# Patient Record
Sex: Male | Born: 1980
Health system: Southern US, Community
[De-identification: ages and names within clinical notes are randomized; demographics above are authoritative.]

## PROBLEM LIST (undated history)

## (undated) DIAGNOSIS — I10 Essential (primary) hypertension: Secondary | ICD-10-CM

## (undated) HISTORY — DX: Essential (primary) hypertension: I10

## (undated) HISTORY — PX: BICEPS TENDON REPAIR: SHX566

## (undated) HISTORY — PX: HAND SURGERY: SHX662

---

## 2015-10-24 ENCOUNTER — Emergency Department (HOSPITAL_COMMUNITY)
Admission: EM | Admit: 2015-10-24 | Discharge: 2015-10-25 | Disposition: A | Discharge status: Home / Self Care | Payer: 59 | Attending: Emergency Medicine | Admitting: Emergency Medicine

## 2015-10-24 ENCOUNTER — Encounter (HOSPITAL_COMMUNITY): Payer: Self-pay | Admitting: Emergency Medicine

## 2015-10-24 DIAGNOSIS — R0600 Dyspnea, unspecified: Secondary | ICD-10-CM | POA: Insufficient documentation

## 2015-10-24 DIAGNOSIS — R21 Rash and other nonspecific skin eruption: Secondary | ICD-10-CM | POA: Diagnosis not present

## 2015-10-24 DIAGNOSIS — R61 Generalized hyperhidrosis: Secondary | ICD-10-CM | POA: Diagnosis not present

## 2015-10-24 DIAGNOSIS — J351 Hypertrophy of tonsils: Secondary | ICD-10-CM | POA: Insufficient documentation

## 2015-10-24 DIAGNOSIS — R059 Cough, unspecified: Secondary | ICD-10-CM

## 2015-10-24 DIAGNOSIS — R05 Cough: Secondary | ICD-10-CM | POA: Diagnosis not present

## 2015-10-24 DIAGNOSIS — R0602 Shortness of breath: Secondary | ICD-10-CM | POA: Insufficient documentation

## 2015-10-24 DIAGNOSIS — F17200 Nicotine dependence, unspecified, uncomplicated: Secondary | ICD-10-CM | POA: Insufficient documentation

## 2015-10-24 NOTE — ED Notes (Signed)
Pt states he was melting some plastic earlier today to patch a belt   Pt states since then he has developed a rash on his arms, profuse sweating, and chest tightness

## 2015-10-25 MED ORDER — BENZONATATE 100 MG PO CAPS: 100.0000 mg | ORAL_CAPSULE | Freq: Three times a day (TID) | ORAL | Status: DC

## 2015-10-25 MED ORDER — FAMOTIDINE 20 MG PO TABS
20.0000 mg | ORAL_TABLET | Freq: Once | ORAL | Status: DC
  Filled 2015-10-25: qty 1

## 2015-10-25 MED ORDER — FAMOTIDINE 20 MG PO TABS: 20.0000 mg | ORAL_TABLET | Freq: Two times a day (BID) | ORAL | Status: DC

## 2015-10-25 MED ORDER — PREDNISONE 20 MG PO TABS: 60.0000 mg | ORAL_TABLET | Freq: Every day | ORAL | Status: DC

## 2015-10-25 MED ORDER — PREDNISONE 20 MG PO TABS
60.0000 mg | ORAL_TABLET | Freq: Once | ORAL | Status: DC
  Filled 2015-10-25: qty 3

## 2015-10-25 NOTE — Discharge Instructions (Signed)

## 2015-10-25 NOTE — ED Provider Notes (Signed)
CSN: [id number]     Arrival date & time 10/24/15  2206 History   First MD Initiated Contact with Patient 10/25/15 0206     Chief Complaint  Patient presents with  . 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 Symptoms started around 5:00 pm. He had some chest pressure worse with inspiration. No fever, vomiting, initial rash or difficulty swallowing. He tried taking Robitussin at home without relief. He states that 2-3 weeks ago he had a similar episode, again while working with melted plastic, that was treated by his doctor with Amoxil and diagnosed as bronchitis. He is a smoker. Since arrival in the emergency department he has developed a red itchy rash to bilateral volar forearms.   The history is provided by the patient. No language interpreter was used.    History reviewed. No pertinent past medical history. Past Surgical History  Procedure Laterality Date  . Biceps tendon repair    . Hand surgery     Family History  Problem Relation Age of Onset  . Hypertension Other   . Hyperlipidemia Other   . CAD Other    Social History  Substance Use Topics  . Smoking status: Current Every Day Smoker  . Smokeless tobacco: None  . Alcohol Use: Yes     Comment: occ    Review of Systems  Constitutional: Negative for fever and chills.  HENT: Negative.  Negative for sore throat and trouble swallowing.   Respiratory: Positive for cough and shortness of breath. Negative for wheezing.   Cardiovascular: Negative.   Gastrointestinal: Negative.  Negative for nausea and vomiting.  Musculoskeletal: Negative.  Negative for myalgias.  Skin: Positive for rash.  Neurological: Negative.       Allergies  Review of patient's allergies indicates no known allergies.  Home Medications   Prior to  Admission medications   Not on File   BP 138/82 mmHg  Pulse 93  Temp(Src) 98.2 F (36.8 C) (Oral)  Resp 25  Ht 6' (1.829 m)  Wt 99.791 kg  BMI 29.83 kg/m2  SpO2 96% Physical Exam  Constitutional: He is oriented to person, place, and time. He appears well-developed and well-nourished.  HENT:  Head: Normocephalic.  Oropharyngeal redness without exudates or tonsillar swelling.  Eyes: Conjunctivae are normal.  Neck: Normal range of motion. Neck supple.  Cardiovascular: Normal rate and regular rhythm.   Pulmonary/Chest: Effort normal and breath sounds normal. He has no wheezes. He has no rales.  Abdominal: Soft. Bowel sounds are normal. There is no tenderness. There is no rebound and no guarding.  Musculoskeletal: Normal range of motion. He exhibits no edema.  Neurological: He is alert and oriented to person, place, and time.  Skin: Skin is warm and dry. No rash noted.  Red, raised maculopapular rash to bilateral volar forearms. No hives. No blistering.   Psychiatric: He has a normal mood and affect.    ED Course  Procedures (including critical care time) Labs Review Labs Reviewed - No data to display  Imaging Review No results found. I have personally reviewed and evaluated these images and lab results as part of my medical decision-making.   EKG Interpretation None      MDM   Final diagnoses:  None    1. Cough 2. Rash  DDx: reaction to burning plastic vs bronchitis. Both times he developed symptoms he was working with burning  plastic, now with rash. Have to suspect allergic or adverse affect of exposure. Will treat with Decadron. Encouraged Benadryl and Pepcid. Follow up with PCP for further management if symptoms persist, return here if he worsens.     Elpidio AnisShari Bartley Vuolo, PA-C 10/26/15 [id number]  Tomasita CrumbleAdeleke Oni, MD 10/26/15 [phone]

## 2016-11-13 ENCOUNTER — Ambulatory Visit (INDEPENDENT_AMBULATORY_CARE_PROVIDER_SITE_OTHER): Payer: Worker's Compensation | Admitting: Physician Assistant

## 2016-11-13 ENCOUNTER — Telehealth: Payer: Self-pay | Admitting: Physician Assistant

## 2016-11-13 VITALS — BP 151/79 | HR 63 | Resp 18 | Ht 72.0 in

## 2016-11-13 DIAGNOSIS — S61210A Laceration without foreign body of right index finger without damage to nail, initial encounter: Secondary | ICD-10-CM

## 2016-11-13 MED ORDER — CEPHALEXIN 500 MG PO CAPS: 500.0000 mg | ORAL_CAPSULE | Freq: Three times a day (TID) | ORAL | 0 refills | Status: DC

## 2016-11-13 NOTE — Telephone Encounter (Signed)
Patient left his RX for cephALEXin (KEFLEX) 500 MG capsule at work and will not be able to get it until Tuesday he would like for it to be sent to the [facility] on [address] for him.

## 2016-11-13 NOTE — Progress Notes (Signed)
MRN: [id number] DOB: 06-16-1981  Subjective:  Pt presents to clinic with an injury that occurred at work today.  He was opening a box and he cut his finger.  He has had a lot of bleeding from the wound but he has not cleaned it.  He does not have pain moving the finger.  His last tdap was less than 4 years ago.  Right handed.    Review of Systems  Patients medications, problem list and allergies reviewed. Patients social, past and surgical history is reviewed.   Objective:  BP (!) 151/79   Pulse 63   Resp 18   Ht 6' (1.829 m)   SpO2 100%   Physical Exam  Constitutional: He is oriented to person, place, and time and well-developed, well-nourished, and in no distress.  HENT:  Head: Normocephalic and atraumatic.  Right Ear: External ear normal.  Left Ear: External ear normal.  Eyes: Conjunctivae are normal.  Neck: Normal range of motion.  Pulmonary/Chest: Effort normal.  Musculoskeletal:  Pt has FROM with good strength without pain with flexion and extension.  1 cm laceration over the right index DIP joint on the radial aspect of the joint.  The capsule is lacerated but the tendon is not seen nor the bone.  Neurological: He is alert and oriented to person, place, and time. Gait normal.  Skin: Skin is warm and dry.  Psychiatric: Mood, memory, affect and judgment normal.   Procedure:  Consent obtained.  Local anesthesia with 2% lido.  Wound explored - joint capsule lacerated but the tendon is not seen . Assessment and Plan :  Laceration of right index finger without foreign body without damage to nail, initial encounter - Plan: cephALEXin (KEFLEX) 500 MG capsule - wound care d/w pt.  Pt was encouraged to keep a bulky drsg on the wound to decrease a lot of bending at the joint esp over the next several days - SR in 10 d.   Benny LennertSarah Weber PA-C  [facility] 11/15/2016 9:44 AM

## 2016-11-13 NOTE — Patient Instructions (Addendum)
WOUND CARE  . Keep area clean and dry for 24 hours. Do not remove bandage, if applied. . After 24 hours, remove bandage and wash wound gently with mild soap and warm water. Reapply a new bandage after cleaning wound, if directed. . Continue daily cleansing with soap and water until stitches/staples are removed. . Do not apply any ointments or creams to the wound while stitches/staples are in place, as this may cause delayed healing. . Notify the office if you experience any of the following signs of infection: Swelling, redness, pus drainage, streaking, fever >101.0 F . Notify the office if you experience excessive bleeding that does not stop after 15-20 minutes of constant, firm pressure.      IF you received an x-ray today, you will receive an invoice from [facility]. Please contact [facility] at [phone] with questions or concerns regarding your invoice.   IF you received labwork today, you will receive an invoice from [facility]. Please contact [facility] at [phone] with questions or concerns regarding your invoice.   Our billing staff will not be able to assist you with questions regarding bills from these companies.  You will be contacted with the lab results as soon as they are available. The fastest way to get your results is to activate your My Chart account. Instructions are located on the last page of this paperwork. If you have not heard from us regarding the results in 2 weeks, please contact this office.      

## 2016-11-14 MED ORDER — CEPHALEXIN 500 MG PO CAPS: 500.0000 mg | ORAL_CAPSULE | Freq: Three times a day (TID) | ORAL | 0 refills | Status: DC

## 2016-11-14 NOTE — Telephone Encounter (Signed)
Yes please resend. Deliah Boston, MS, PA-C 10:52 AM, 11/14/2016

## 2016-11-14 NOTE — Telephone Encounter (Signed)
Ok to re-send?  °

## 2016-11-14 NOTE — Telephone Encounter (Signed)
resent

## 2016-12-25 ENCOUNTER — Encounter: Payer: Self-pay | Admitting: Physician Assistant

## 2016-12-25 ENCOUNTER — Ambulatory Visit (INDEPENDENT_AMBULATORY_CARE_PROVIDER_SITE_OTHER): Payer: 59 | Admitting: Physician Assistant

## 2016-12-25 ENCOUNTER — Ambulatory Visit (INDEPENDENT_AMBULATORY_CARE_PROVIDER_SITE_OTHER): Payer: 59

## 2016-12-25 VITALS — BP 130/88 | HR 65 | Temp 98.4°F | Resp 16 | Ht 71.0 in | Wt 237.0 lb

## 2016-12-25 DIAGNOSIS — B070 Plantar wart: Secondary | ICD-10-CM | POA: Diagnosis not present

## 2016-12-25 DIAGNOSIS — Z114 Encounter for screening for human immunodeficiency virus [HIV]: Secondary | ICD-10-CM | POA: Diagnosis not present

## 2016-12-25 DIAGNOSIS — Z6832 Body mass index (BMI) 32.0-32.9, adult: Secondary | ICD-10-CM | POA: Insufficient documentation

## 2016-12-25 DIAGNOSIS — M25572 Pain in left ankle and joints of left foot: Secondary | ICD-10-CM | POA: Diagnosis not present

## 2016-12-25 DIAGNOSIS — Z6833 Body mass index (BMI) 33.0-33.9, adult: Secondary | ICD-10-CM | POA: Diagnosis not present

## 2016-12-25 DIAGNOSIS — I10 Essential (primary) hypertension: Secondary | ICD-10-CM

## 2016-12-25 DIAGNOSIS — M25571 Pain in right ankle and joints of right foot: Secondary | ICD-10-CM

## 2016-12-25 MED ORDER — INDOMETHACIN 50 MG PO CAPS: 50.0000 mg | ORAL_CAPSULE | Freq: Two times a day (BID) | ORAL | 1 refills | Status: DC

## 2016-12-25 MED ORDER — LISINOPRIL 5 MG PO TABS: 5.0000 mg | ORAL_TABLET | Freq: Every day | ORAL | 3 refills | Status: DC

## 2016-12-25 NOTE — Assessment & Plan Note (Signed)
Continue healthy lifestyle modifications.

## 2016-12-25 NOTE — Assessment & Plan Note (Signed)
Restart treatment, with lisinopril 5 mg. COntinue healthy lifestyle modifications.

## 2016-12-25 NOTE — Progress Notes (Signed)
Patient ID: Rickey Davis, male     DOB: 15-Feb-1981, 36 y.o.    MRN: [id number]  PCP: Patient, No Pcp Per  Chief Complaint  Patient presents with  . Gout    LEFT ANKLE   . elevated BP at triage    pt states family history HTN    Subjective:   This patient is new to me and presents for evaluation of LEFT ankle pain and HTN.  Previously seen here for laceration repair.  Started with pain in the LEFT great toe 4 days ago. Subtle. That resolved, as he noted pain in the ankle, where is usually occurs. Lateral LEFT ankle pain episodically. Monthly x 2 weeks at a time, until he lost weight by changing lifestyle, and he hadn't had an episode in about 6 months until this one. Usually takes ibuprofen, though it doesn't work, tart cherry juice, and tries to hydrate. Has also eliminated shrimp for his diet. History of ankle fracture.  HTN previously treated with metoprolol. Stopped it when he lost health insurance and couldn't afford care.  Review of Systems No chest pain, SOB, HA, dizziness, vision change, N/V, diarrhea, constipation, dysuria, urinary urgency or frequency, myalgias, other arthralgias or rash.   Prior to Admission medications   Medication Sig Start Date End Date Taking? Authorizing Provider  ibuprofen (ADVIL,MOTRIN) 200 MG tablet Take 200 mg by mouth as needed.   Yes [provider]     No Known Allergies   Patient Active Problem List   Diagnosis Date Noted  . Benign essential HTN 12/25/2016  . BMI 33.0-33.9,adult 12/25/2016     Family History  Problem Relation Age of Onset  . Hypertension Other   . Hyperlipidemia Other   . CAD Other   . Hypertension Mother   . Hypertension Father   . Non-Hodgkin's lymphoma Sister   . Hypertension Brother   . Hyperlipidemia Brother      Social History   Social History  . Marital status: Single    Spouse name: n/a  . Number of children: 0  . Years of education: 3 years of college   Occupational History   . maintenance [employer]   Social History Main Topics  . Smoking status: Current Every Day Smoker    Packs/day: 0.75    Years: 13.00  . Smokeless tobacco: Never Used  . Alcohol use Yes     Comment: occ  . Drug use: Yes    Types: Marijuana     Comment: once a year  . Sexual activity: Not on file   Other Topics Concern  . Not on file   Social History Narrative   Lives alone.   [place]ister lives in [place], [place]   Other family lives in [place].         Objective:  Physical Exam  Constitutional: He is oriented to person, place, and time. He appears well-developed and well-nourished. He is active and cooperative. No distress.  BP 130/88 (BP Location: Left Arm, Cuff Size: Large)   Pulse 65   Temp 98.4 F (36.9 C) (Oral)   Resp 16   Ht 5\' 11"  (1.803 m)   Wt 237 lb (107.5 kg)   SpO2 97%   BMI 33.05 kg/m    Eyes: Conjunctivae are normal.  Pulmonary/Chest: Effort normal.  Neurological: He is alert and oriented to person, place, and time.  Skin: Lesion (verrucal lesion on plantar surfacce of LEFT foot) noted.  Psychiatric: He has a  normal mood and affect. His speech is normal and behavior is normal.         Dg Ankle Complete Left  Result Date: 12/25/2016 CLINICAL DATA:  Left lateral ankle pain. History of previous fracture. EXAM: LEFT ANKLE COMPLETE - 3+ VIEW COMPARISON:  None in PACs FINDINGS: The bones are subjectively adequately mineralized. There are subcentimeter bony densities adjacent to the inferior tip of the lateral malleolus likely reflecting old avulsion fracture fragments. The ankle joint mortise is preserved. The talar dome is intact. There is no acute malleolar fracture. The talus and calcaneus appear intact. There is mild diffuse soft tissue swelling. IMPRESSION: No acute fracture or dislocation. Evidence of previous fracture likely of the tip of the lateral malleolus. Electronically Signed   By: David  Swaz[place] M.D.   On:  12/25/2016 10:28       Assessment & Plan:   Problem List Items Addressed This Visit    Benign essential HTN    Restart treatment, with lisinopril 5 mg. COntinue healthy lifestyle modifications.      Relevant Medications   lisinopril (PRINIVIL,ZESTRIL) 5 MG tablet   Other Relevant Orders   CBC with Differential/Platelet   Comprehensive metabolic panel   Lipid panel   TSH   T4, free   BMI 33.0-33.9,adult    Continue healthy lifestyle modifications.      Plantar wart of left foot    Continue home treatment. COnsider cryotherapy in the future if desires.       Other Visit Diagnoses    Pain in joint involving right ankle and foot    -  Primary   Possible gout. Indomethacin trial. If uric acid is elevated, will start allopurinol. Continue hydration.   Relevant Medications   indomethacin (INDOCIN) 50 MG capsule   Other Relevant Orders   DG Ankle Complete Left (Completed)   Uric Acid   Screening for HIV (human immunodeficiency virus)       Relevant Orders   HIV antibody       Return in about 6 weeks (around 02/05/2017) for re-evaluation of blood pressure.   Fernande Bras, PA-C Primary Care at [facility]

## 2016-12-25 NOTE — Assessment & Plan Note (Signed)
Continue home treatment. COnsider cryotherapy in the future if desires.

## 2016-12-25 NOTE — Patient Instructions (Signed)
     IF you received an x-ray today, you will receive an invoice from [facility]. Please contact [facility] at [phone] with questions or concerns regarding your invoice.   IF you received labwork today, you will receive an invoice from [facility]. Please contact [facility] at [phone] with questions or concerns regarding your invoice.   Our billing staff will not be able to assist you with questions regarding bills from these companies.  You will be contacted with the lab results as soon as they are available. The fastest way to get your results is to activate your My Chart account. Instructions are located on the last page of this paperwork. If you have not heard from us regarding the results in 2 weeks, please contact this office.     

## 2016-12-26 LAB — COMPREHENSIVE METABOLIC PANEL
A/G RATIO: 2.2 (ref 1.2–2.2)
ALK PHOS: 66 IU/L (ref 39–117)
ALT: 26 IU/L (ref 0–44)
AST: 14 IU/L (ref 0–40)
Albumin: 5 g/dL (ref 3.5–5.5)
BILIRUBIN TOTAL: 0.4 mg/dL (ref 0.0–1.2)
BUN/Creatinine Ratio: 13 (ref 9–20)
BUN: 14 mg/dL (ref 6–20)
CHLORIDE: 100 mmol/L (ref 96–106)
CO2: 26 mmol/L (ref 18–29)
Calcium: 9.7 mg/dL (ref 8.7–10.2)
Creatinine, Ser: 1.04 mg/dL (ref 0.76–1.27)
GFR calc non Af Amer: 91 mL/min/{1.73_m2} (ref >59)
GFR, EST AFRICAN AMERICAN: 106 mL/min/{1.73_m2} (ref >59)
GLUCOSE: 91 mg/dL (ref 65–99)
Globulin, Total: 2.3 g/dL (ref 1.5–4.5)
POTASSIUM: 4.9 mmol/L (ref 3.5–5.2)
Sodium: 139 mmol/L (ref 134–144)
Total Protein: 7.3 g/dL (ref 6.0–8.5)

## 2016-12-26 LAB — CBC WITH DIFFERENTIAL/PLATELET

## 2016-12-26 LAB — HIV ANTIBODY (ROUTINE TESTING W REFLEX): HIV SCREEN 4TH GENERATION: NONREACTIVE

## 2016-12-26 LAB — LIPID PANEL
CHOLESTEROL TOTAL: 208 mg/dL — AB (ref 100–199)
Chol/HDL Ratio: 5 ratio (ref 0.0–5.0)
HDL: 42 mg/dL (ref >39)
LDL Calculated: 127 mg/dL — ABNORMAL HIGH (ref 0–99)
TRIGLYCERIDES: 193 mg/dL — AB (ref 0–149)
VLDL Cholesterol Cal: 39 mg/dL (ref 5–40)

## 2016-12-26 LAB — URIC ACID: Uric Acid: 8.1 mg/dL (ref 3.7–8.6)

## 2016-12-26 LAB — T4, FREE: Free T4: 1.08 ng/dL (ref 0.82–1.77)

## 2016-12-26 LAB — TSH: TSH: 1.51 u[IU]/mL (ref 0.450–4.500)

## 2017-01-01 ENCOUNTER — Other Ambulatory Visit: Payer: Self-pay

## 2017-01-01 MED ORDER — ALLOPURINOL 100 MG PO TABS: 100.0000 mg | ORAL_TABLET | Freq: Every day | ORAL | 3 refills | Status: DC

## 2017-01-01 NOTE — Telephone Encounter (Signed)
See lab note.  pt has been feeling faint since on ibuprofen and lisinopril when exerting self at work. No chest pain or sob I advised take b/p when this occurs and notify us of readings. Drink plenty of fluids.

## 2017-01-09 ENCOUNTER — Encounter: Payer: Self-pay | Admitting: Radiology

## 2017-01-27 ENCOUNTER — Ambulatory Visit (INDEPENDENT_AMBULATORY_CARE_PROVIDER_SITE_OTHER): Payer: 59 | Admitting: Physician Assistant

## 2017-01-27 ENCOUNTER — Encounter: Payer: Self-pay | Admitting: Physician Assistant

## 2017-01-27 VITALS — BP 130/84 | HR 72 | Temp 97.6°F | Ht 72.0 in | Wt 239.4 lb

## 2017-01-27 DIAGNOSIS — Z6832 Body mass index (BMI) 32.0-32.9, adult: Secondary | ICD-10-CM

## 2017-01-27 DIAGNOSIS — I10 Essential (primary) hypertension: Secondary | ICD-10-CM

## 2017-01-27 DIAGNOSIS — M109 Gout, unspecified: Secondary | ICD-10-CM | POA: Diagnosis not present

## 2017-01-27 NOTE — Progress Notes (Signed)
   Subjective:    Patient ID: Rickey Davis, male    DOB: Feb 21, 1981, 36 y.o.   MRN: [id number] PCP: Porfirio OarJeffery, Chelle, PA-C  HPI: 36 y/o M presents today for a follow up of his blood pressure. He was started on Lisinopril 2656m on 12/26/2016. When he first started the medication he had some associated lightheadedness that has completely resolved. He denies vision changes, CP, SOB, lower extremity swelling. Diet changes have been made and include more salads than he has eaten in the past, drinks mostly gatorade.  Exercise- mostly at work, Data processing managermaintenance mechanic.   Patient Active Problem List   Diagnosis Date Noted  . Benign essential HTN 12/25/2016  . BMI 33.0-33.9,adult 12/25/2016  . Plantar wart of left foot 12/25/2016   Prior to Admission medications   Medication Sig Start Date End Date Taking? Authorizing Provider  allopurinol (ZYLOPRIM) 100 MG tablet Take 1 tablet (100 mg total) by mouth daily. 01/01/17   Porfirio OarJeffery, Chelle, PA-C  ibuprofen (ADVIL,MOTRIN) 200 MG tablet Take 200 mg by mouth as needed.    [provider]  indomethacin (INDOCIN) 50 MG capsule Take 1 capsule (50 mg total) by mouth 2 (two) times daily with a meal. 12/25/16   Jeffery, Chelle, PA-C  lisinopril (PRINIVIL,ZESTRIL) 5 MG tablet Take 1 tablet (5 mg total) by mouth daily. 12/25/16   Porfirio OarJeffery, Chelle, PA-C   No Known Allergies    Review of Systems  Constitutional: Positive for fatigue.  Eyes: Negative for visual disturbance.  Respiratory: Negative for cough, chest tightness and shortness of breath.   Cardiovascular: Negative for chest pain.  Gastrointestinal: Negative for abdominal pain, constipation, diarrhea, nausea and vomiting.  Endocrine: Negative for polydipsia and polyuria.  Genitourinary: Negative.   Musculoskeletal: Negative.   Skin: Negative.   Allergic/Immunologic: Negative.   Neurological: Negative for dizziness and headaches.  Hematological: Negative.   Psychiatric/Behavioral: Negative.          Objective:   Physical Exam  Constitutional: He is oriented to person, place, and time. He appears well-developed and well-nourished. No distress.  BP 130/84 (BP Location: Left Arm, Patient Position: Sitting, Cuff Size: Large)   Pulse 72   Temp 97.6 F (36.4 C) (Oral)   Ht 6' (1.829 m)   Wt 239 lb 6.4 oz (108.6 kg)   SpO2 98%   BMI 32.47 kg/m    HENT:  Head: Normocephalic and atraumatic.  Eyes: Conjunctivae and EOM are normal. Pupils are equal, round, and reactive to light.  Neck: Normal range of motion. Neck supple. No thyromegaly present.  Cardiovascular: Normal rate, regular rhythm, normal heart sounds and intact distal pulses.   Pulmonary/Chest: Effort normal and breath sounds normal.  Lymphadenopathy:    He has no cervical adenopathy.  Neurological: He is alert and oriented to person, place, and time.  Skin: Skin is warm and dry. He is not diaphoretic.  Psychiatric: He has a normal mood and affect. His behavior is normal. Judgment and thought content normal.        Assessment & Plan:  1. Benign essential HTN Blood pressure well controlled with Lisinopril 5 mg and lifestyle changes.  - Basic metabolic panel  2. Gout of left ankle, unspecified cause, unspecified chronicity Last attack about a month ago. Well controlled with Allopurinol.  - Uric Acid  3. BMI 32.0-32.9,adult Continue to make smart food choices and maintain active lifestyle.  Return in about 6 months (around 07/29/2017) for re-evaluation of blood pressure.

## 2017-01-27 NOTE — Progress Notes (Signed)
Patient ID: Rickey Davis, male    DOB: 30-Nov-1980, 36 y.o.   MRN: [id number]  PCP: Porfirio Oar, PA-C  Chief Complaint  Patient presents with  . Hypertension  . Follow-up    Subjective:   Presents for evaluation of HTN and presumed gout.  At his last visit, for recurrent ankle pain, he was started on indomethacin and lisinopril. Uric acid level was at the upper limit of normal, so he was started on allopurinol. He has tolerated new medications well and has not had another episode of the ankle pain since then. Initially, he had a little dizziness with the lisinopril, which resolved.  No CP, SOB, HA. No muscle or joint pain.  He has made some healthy eating changes, more salads.  Review of Systems No chest pain, SOB, HA, dizziness, vision change, N/V, diarrhea, constipation, dysuria, urinary urgency or frequency, myalgias, arthralgias or rash.     Patient Active Problem List   Diagnosis Date Noted  . Benign essential HTN 12/25/2016  . BMI 33.0-33.9,adult 12/25/2016  . Plantar wart of left foot 12/25/2016     Prior to Admission medications   Medication Sig Start Date End Date Taking? Authorizing Provider  allopurinol (ZYLOPRIM) 100 MG tablet Take 1 tablet (100 mg total) by mouth daily. 01/01/17  Yes Kahne Helfand, PA-C  ibuprofen (ADVIL,MOTRIN) 200 MG tablet Take 200 mg by mouth as needed.   Yes [provider]  indomethacin (INDOCIN) 50 MG capsule Take 1 capsule (50 mg total) by mouth 2 (two) times daily with a meal. 12/25/16  Yes Jhony Antrim, PA-C  lisinopril (PRINIVIL,ZESTRIL) 5 MG tablet Take 1 tablet (5 mg total) by mouth daily. 12/25/16  Yes Amandajo Gonder, PA-C     No Known Allergies     Objective:  Physical Exam  Constitutional: He is oriented to person, place, and time. He appears well-developed and well-nourished. He is active and cooperative. No distress.  BP 130/84 (BP Location: Left Arm, Patient Position: Sitting, Cuff Size: Large)    Pulse 72   Temp 97.6 F (36.4 C) (Oral)   Ht 6' (1.829 m)   Wt 239 lb 6.4 oz (108.6 kg)   SpO2 98%   BMI 32.47 kg/m   HENT:  Head: Normocephalic and atraumatic.  Right Ear: Hearing normal.  Left Ear: Hearing normal.  Eyes: Conjunctivae are normal. No scleral icterus.  Neck: Normal range of motion. Neck supple. No thyromegaly present.  Cardiovascular: Normal rate, regular rhythm and normal heart sounds.   Pulses:      Radial pulses are 2+ on the right side, and 2+ on the left side.  Pulmonary/Chest: Effort normal and breath sounds normal.  Lymphadenopathy:       Head (right side): No tonsillar, no preauricular, no posterior auricular and no occipital adenopathy present.       Head (left side): No tonsillar, no preauricular, no posterior auricular and no occipital adenopathy present.    He has no cervical adenopathy.       Right: No supraclavicular adenopathy present.       Left: No supraclavicular adenopathy present.  Neurological: He is alert and oriented to person, place, and time. No sensory deficit.  Skin: Skin is warm, dry and intact. No rash noted. No cyanosis or erythema. Nails show no clubbing.  Psychiatric: He has a normal mood and affect. His speech is normal and behavior is normal.       Wt Readings from Last 3 Encounters:  01/27/17 239 lb  6.4 oz (108.6 kg)  12/25/16 237 lb (107.5 kg)  10/24/15 220 lb (99.8 kg)       Assessment & Plan:   Problem List Items Addressed This Visit    Benign essential HTN - Primary    Controlled. Continue lisinopril 5 mg daily. Continue healthy eating changes.      Relevant Orders   Basic metabolic panel   BMI 32.0-32.9,adult    Continue healthy lifestyle changes.      Gout of left ankle    Stable on allopurinol. PRN indomethacin.      Relevant Orders   Uric Acid       Return in about 6 months (around 07/29/2017) for re-evaluation of blood pressure.   Fernande Brashelle S. Rhyan Radler, PA-C [facility] at [facility]

## 2017-01-27 NOTE — Assessment & Plan Note (Signed)
Controlled. Continue lisinopril 5 mg daily. Continue healthy eating changes.

## 2017-01-27 NOTE — Patient Instructions (Signed)
     IF you received an x-ray today, you will receive an invoice from [facility]. Please contact [facility] at [phone] with questions or concerns regarding your invoice.   IF you received labwork today, you will receive an invoice from [facility]. Please contact [facility] at [phone] with questions or concerns regarding your invoice.   Our billing staff will not be able to assist you with questions regarding bills from these companies.  You will be contacted with the lab results as soon as they are available. The fastest way to get your results is to activate your My Chart account. Instructions are located on the last page of this paperwork. If you have not heard from us regarding the results in 2 weeks, please contact this office.     

## 2017-01-27 NOTE — Assessment & Plan Note (Signed)
Continue healthy lifestyle changes.

## 2017-01-27 NOTE — Assessment & Plan Note (Signed)
Stable on allopurinol. PRN indomethacin.

## 2017-01-28 LAB — BASIC METABOLIC PANEL
BUN/Creatinine Ratio: 21 — ABNORMAL HIGH (ref 9–20)
BUN: 17 mg/dL (ref 6–20)
CALCIUM: 9.9 mg/dL (ref 8.7–10.2)
CHLORIDE: 100 mmol/L (ref 96–106)
CO2: 23 mmol/L (ref 20–29)
Creatinine, Ser: 0.82 mg/dL (ref 0.76–1.27)
GFR calc Af Amer: 130 mL/min/{1.73_m2} (ref >59)
GFR, EST NON AFRICAN AMERICAN: 112 mL/min/{1.73_m2} (ref >59)
GLUCOSE: 108 mg/dL — AB (ref 65–99)
POTASSIUM: 5.1 mmol/L (ref 3.5–5.2)
SODIUM: 140 mmol/L (ref 134–144)

## 2017-01-28 LAB — URIC ACID: URIC ACID: 6.2 mg/dL (ref 3.7–8.6)

## 2017-02-23 ENCOUNTER — Encounter: Payer: Self-pay | Admitting: Physician Assistant

## 2017-06-22 IMAGING — DX DG ANKLE COMPLETE 3+V*L*
3 series · 3 of 3 positions shown · non-contrast
Comparison: None in PACs

CLINICAL DATA: Left lateral ankle pain. History of previous
fracture.

EXAM:
LEFT ANKLE COMPLETE - 3+ VIEW

[ankle ap]
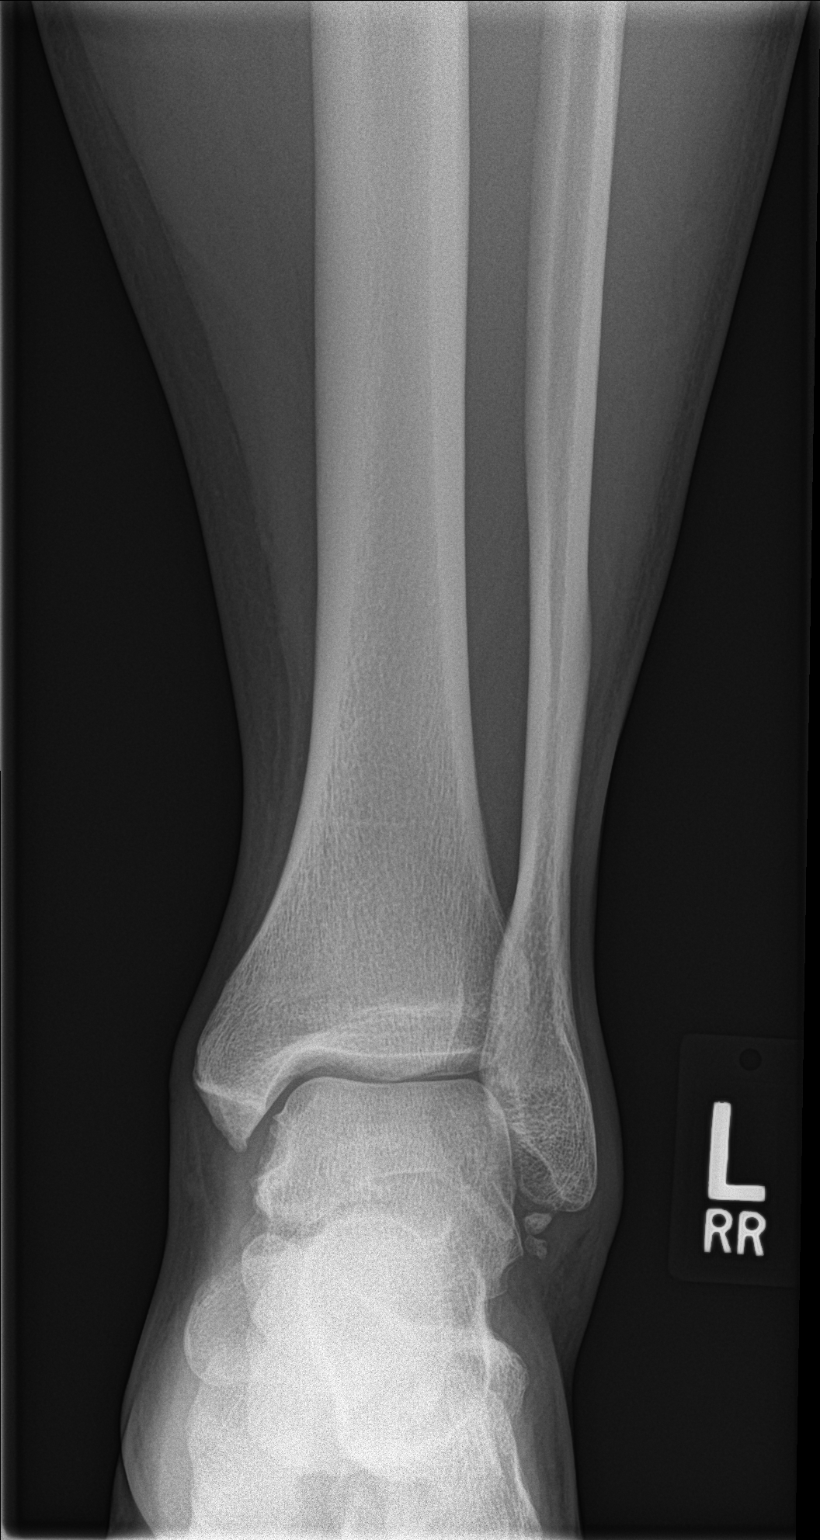

[ankle obl]
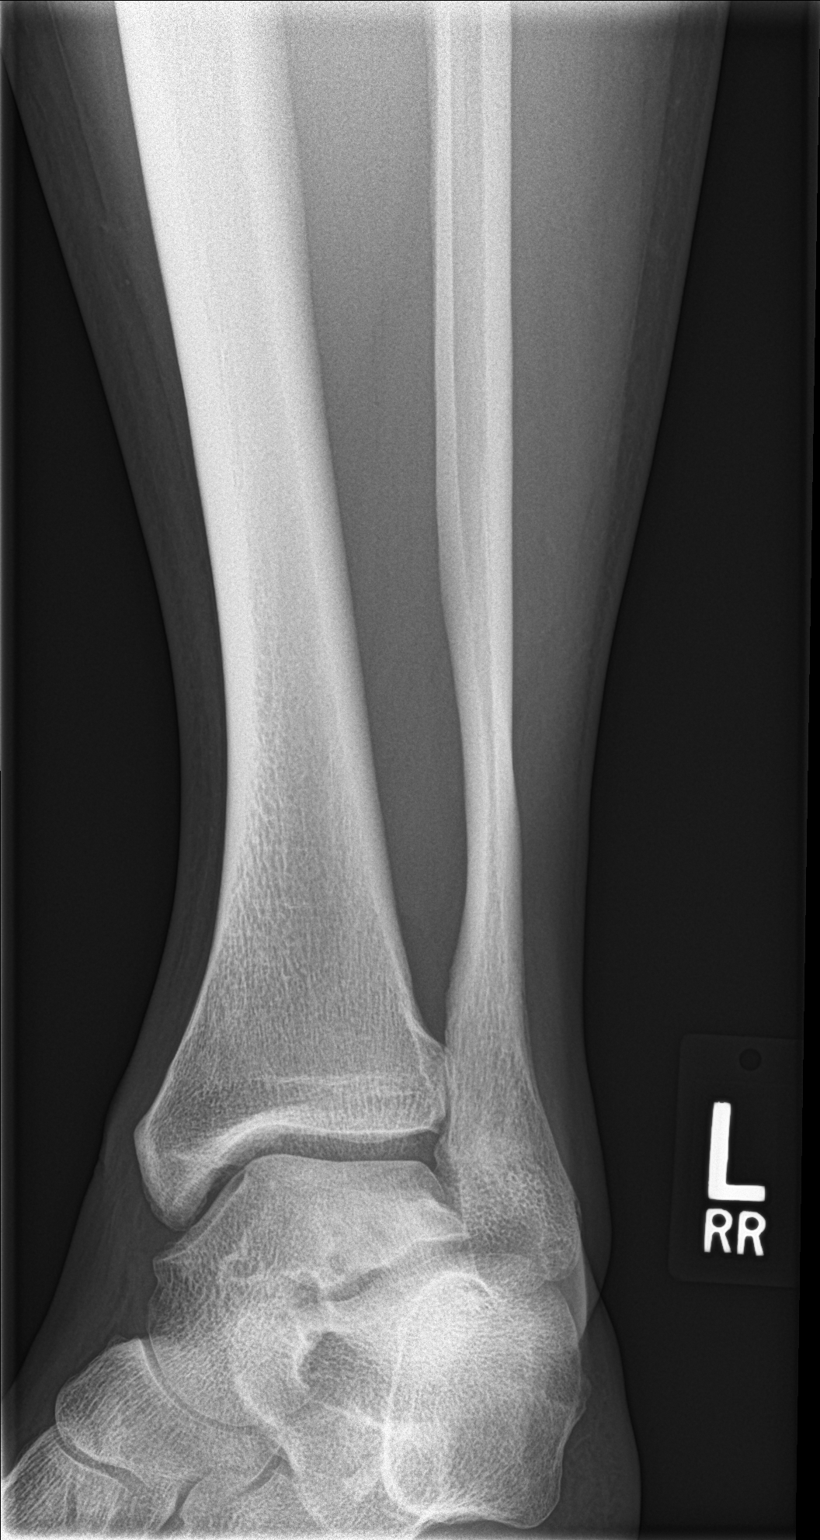

[ankle lat]
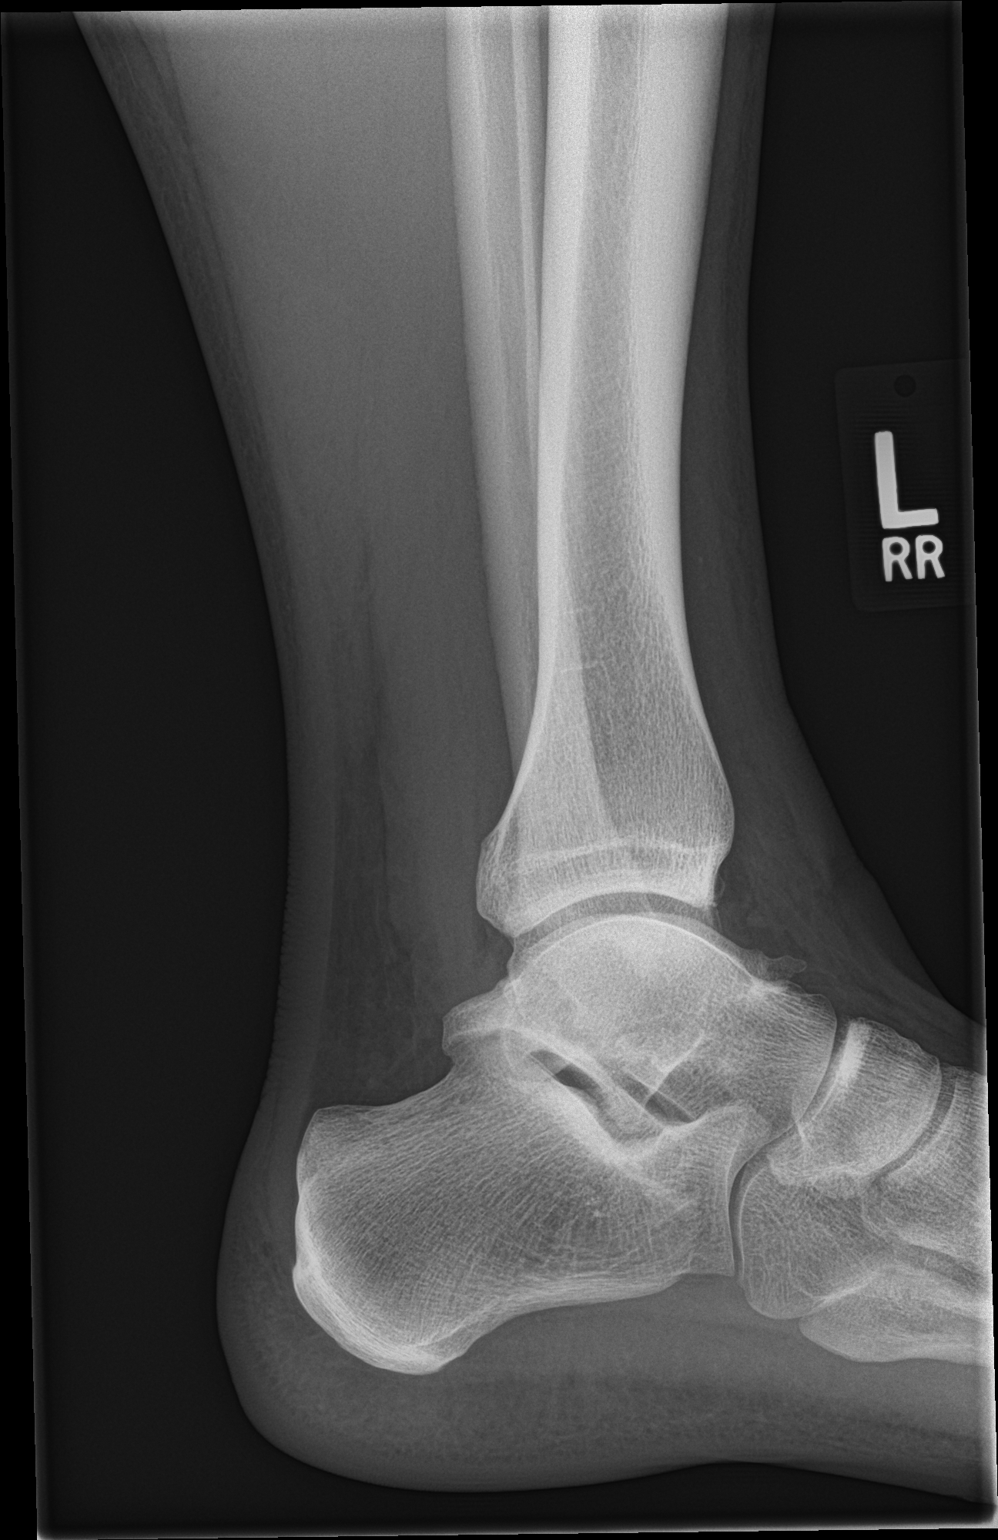

[3 of 3 positions shown; findings below may reference images not displayed]

FINDINGS: The bones are subjectively adequately mineralized. There are
subcentimeter bony densities adjacent to the inferior tip of the
lateral malleolus likely reflecting old avulsion fracture fragments.
The ankle joint mortise is preserved. The talar dome is intact.
There is no acute malleolar fracture. The talus and calcaneus appear
intact. There is mild diffuse soft tissue swelling.
IMPRESSION: No acute fracture or dislocation. Evidence of previous fracture
likely of the tip of the lateral malleolus.

## 2017-07-31 ENCOUNTER — Ambulatory Visit: Payer: Self-pay | Admitting: Physician Assistant

## 2017-11-19 ENCOUNTER — Encounter: Payer: Self-pay | Admitting: Physician Assistant

## 2018-01-07 ENCOUNTER — Other Ambulatory Visit: Payer: Self-pay | Admitting: Physician Assistant

## 2018-01-07 DIAGNOSIS — I10 Essential (primary) hypertension: Secondary | ICD-10-CM

## 2018-09-28 DIAGNOSIS — M25521 Pain in right elbow: Secondary | ICD-10-CM | POA: Insufficient documentation

## 2018-11-30 ENCOUNTER — Other Ambulatory Visit: Payer: Self-pay

## 2018-11-30 DIAGNOSIS — Z13228 Encounter for screening for other metabolic disorders: Secondary | ICD-10-CM

## 2018-11-30 DIAGNOSIS — Z1329 Encounter for screening for other suspected endocrine disorder: Secondary | ICD-10-CM

## 2018-11-30 DIAGNOSIS — Z13220 Encounter for screening for lipoid disorders: Secondary | ICD-10-CM

## 2018-11-30 DIAGNOSIS — Z130 Encounter for screening for diseases of the blood and blood-forming organs and certain disorders involving the immune mechanism: Secondary | ICD-10-CM

## 2018-11-30 DIAGNOSIS — Z1389 Encounter for screening for other disorder: Secondary | ICD-10-CM

## 2018-12-06 ENCOUNTER — Telehealth: Payer: Self-pay | Admitting: Family Medicine

## 2018-12-06 NOTE — Telephone Encounter (Signed)
calle patient no VM set up unable to leave message . Need to convert OV to Webex or telemed  FR

## 2018-12-10 ENCOUNTER — Encounter: Payer: Self-pay | Admitting: Family Medicine

## 2018-12-20 ENCOUNTER — Telehealth (INDEPENDENT_AMBULATORY_CARE_PROVIDER_SITE_OTHER): Payer: 59 | Admitting: Family Medicine

## 2018-12-20 ENCOUNTER — Other Ambulatory Visit: Payer: Self-pay

## 2018-12-20 DIAGNOSIS — F17200 Nicotine dependence, unspecified, uncomplicated: Secondary | ICD-10-CM | POA: Diagnosis not present

## 2018-12-20 DIAGNOSIS — I10 Essential (primary) hypertension: Secondary | ICD-10-CM | POA: Diagnosis not present

## 2018-12-20 DIAGNOSIS — M1A0720 Idiopathic chronic gout, left ankle and foot, without tophus (tophi): Secondary | ICD-10-CM

## 2018-12-20 MED ORDER — BUPROPION HCL ER (SR) 150 MG PO TB12: 150.0000 mg | ORAL_TABLET | Freq: Two times a day (BID) | ORAL | 2 refills | Status: DC

## 2018-12-20 MED ORDER — LISINOPRIL 5 MG PO TABS: 5.0000 mg | ORAL_TABLET | Freq: Every day | ORAL | 1 refills | Status: AC

## 2018-12-20 MED ORDER — INDOMETHACIN 50 MG PO CAPS: 50.0000 mg | ORAL_CAPSULE | Freq: Two times a day (BID) | ORAL | 1 refills | Status: AC | PRN

## 2018-12-20 MED ORDER — ALLOPURINOL 100 MG PO TABS: 100.0000 mg | ORAL_TABLET | Freq: Every day | ORAL | 1 refills | Status: AC

## 2018-12-20 NOTE — Progress Notes (Signed)
Has been off of medication for 57yrs, wanting to get back on the allopurinol 100mg  and lisinopril 5mg .has lab work that is pending for future draw.

## 2018-12-20 NOTE — Progress Notes (Signed)
Virtual Visit Note  I connected with patient on 12/20/18 at 302pm  by phone due to techinical difficulties and verified that I am speaking with the correct person using two identifiers. Rickey Davis is currently located at home and patient is currently with them during visit. The provider, Rickey LippsIrma M Santiago, MD is located in their office at time of visit.  I discussed the limitations, risks, security and privacy concerns of performing an evaluation and management service by telephone and the availability of in person appointments. I also discussed with the patient that there may be a patient responsible charge related to this service. The patient expressed understanding and agreed to proceed.   CC: needing to re-establish care  HPI ? Last OV in 2018 with Rickey DixonJeffrey, PA-C PMH: HTN and gout  Has been without medication for a long time Checks bp occasaional, last week 140/81  Has not had any major gout flare up  manages minor twinges with ibuprofen, has them about once avery month or 2 month, lasts about 2-3 days, left ankle When he was on allopurinol he never had any issues Does not follow any particular diet Gout diagnosed with acute flare up and uric acid 8.1, 2018  Smokes, 1 ppd, x 16 years Has never tried to quit Interested in quiting smoking  Lab Results  Component Value Date   CREATININE 0.82 01/27/2017   BUN 17 01/27/2017   NA 140 01/27/2017   K 5.1 01/27/2017   CL 100 01/27/2017   CO2 23 01/27/2017    No Known Allergies  Prior to Admission medications   Medication Sig Start Date End Date Taking? Authorizing Provider  allopurinol (ZYLOPRIM) 100 MG tablet TAKE 1 TABLET BY MOUTH DAILY 01/08/18   Rickey Davis, [place], PA  ibuprofen (ADVIL,MOTRIN) 200 MG tablet Take 200 mg by mouth as needed.    [provider]  lisinopril (PRINIVIL,ZESTRIL) 5 MG tablet TAKE 1 TABLET BY MOUTH DAILY 01/08/18   Rickey Davis, Chelle, PA    Past Medical History:  Diagnosis Date  . Hypertension      Past Surgical History:  Procedure Laterality Date  . BICEPS TENDON REPAIR    . HAND SURGERY      Social History   Tobacco Use  . Smoking status: Current Every Day Smoker    Packs/day: 0.75    Years: 13.00    Pack years: 9.75  . Smokeless tobacco: Never Used  Substance Use Topics  . Alcohol use: Yes    Comment: occ    Family History  Problem Relation Age of Onset  . Hypertension Other   . Hyperlipidemia Other   . CAD Other   . Hypertension Mother   . Hypertension Father   . Non-Hodgkin's lymphoma Sister   . Hypertension Brother   . Hyperlipidemia Brother     ROS Denies any fever or chills, does endorse fatigue Denies any cough, sob Denies any cp, palpitations, edema Per hpi  Objective  Vitals as reported by the patient: per above   ASSESSMENT and PLAN  1. Idiopathic chronic gout of left ankle without tophus Not well controlled per symptoms. restarting allopurinol, reviewed meds r/se/b - Uric Acid; Future - indomethacin (INDOCIN) 50 MG capsule; Take 1 capsule (50 mg total) by mouth 2 (two) times daily as needed. With meals  2. Benign essential HTN Elevated, off medications, restarting, continue home BP monitoring - Comprehensive metabolic panel; Future - lisinopril (ZESTRIL) 5 MG tablet; Take 1 tablet (5 mg total) by mouth daily. - CBC;  Future - Lipid panel; Future  3. Tobacco use disorder Smoking cessation instruction/counseling given for more than 10 minutes:  counseled patient on the dangers of tobacco use, advised patient to stop smoking, and reviewed strategies to maximize success. After reviewing treatment options patient decided on trial of wellbutrin, reviewed r/se/b  Other orders - allopurinol (ZYLOPRIM) 100 MG tablet; Take 1 tablet (100 mg total) by mouth daily. - buPROPion (WELLBUTRIN SR) 150 MG 12 hr tablet; Take 1 tablet (150 mg total) by mouth 2 (two) times daily.  FOLLOW-UP: 4 weeks   The above assessment and management plan was  discussed with the patient. The patient verbalized understanding of and has agreed to the management plan. Patient is aware to call the clinic if symptoms persist or worsen. Patient is aware when to return to the clinic for a follow-up visit. Patient educated on when it is appropriate to go to the emergency department.    I provided 25 minutes of non-face-to-face time during this encounter.  Rickey Lipps, MD Primary Care at [facility] [address] [place], [place] [address] Ph.  [phone] Fax [phone]

## 2019-05-23 ENCOUNTER — Encounter: Payer: Self-pay | Admitting: Family Medicine

## 2019-05-23 ENCOUNTER — Encounter: Payer: 59 | Admitting: Family Medicine

## 2019-05-23 NOTE — Progress Notes (Signed)
Pt is having congestion, runny nose head and bodyaches. Niece came into town from [place] and became extremely ill yesterday. He has been staying quarantined since he felt his sx. He does not have a way to chk temp at this time./ medication and pharmacy updated.

## 2019-05-27 NOTE — Progress Notes (Signed)
This encounter was created in error - please disregard.

## 2023-03-30 ENCOUNTER — Ambulatory Visit
Admission: EM | Admit: 2023-03-30 | Discharge: 2023-03-30 | Disposition: A | Discharge status: Home / Self Care | Payer: 59 | Attending: Internal Medicine | Admitting: Internal Medicine

## 2023-03-30 DIAGNOSIS — H6993 Unspecified Eustachian tube disorder, bilateral: Secondary | ICD-10-CM | POA: Diagnosis not present

## 2023-03-30 DIAGNOSIS — F17200 Nicotine dependence, unspecified, uncomplicated: Secondary | ICD-10-CM | POA: Diagnosis not present

## 2023-03-30 DIAGNOSIS — H60392 Other infective otitis externa, left ear: Secondary | ICD-10-CM | POA: Diagnosis not present

## 2023-03-30 MED ORDER — NEOMYCIN-POLYMYXIN-HC 3.5-10000-1 OT SUSP: 4.0000 [drp] | Freq: Three times a day (TID) | OTIC | 0 refills | Status: DC

## 2023-03-30 MED ORDER — PSEUDOEPHEDRINE HCL 60 MG PO TABS: 60.0000 mg | ORAL_TABLET | Freq: Three times a day (TID) | ORAL | 0 refills | Status: DC | PRN

## 2023-03-30 MED ORDER — CETIRIZINE HCL 10 MG PO TABS: 10.0000 mg | ORAL_TABLET | Freq: Every day | ORAL | 0 refills | Status: AC

## 2023-03-30 NOTE — Discharge Instructions (Signed)
If you get significantly, worse then come back for a recheck in 48-72 hours. If you stay steady, not improving but not worse, then call and we will see if we can switch to a different antibiotic drop. Use ibuprofen 600mg -800mg  every 8 hours as needed with food for pain and inflammation.

## 2023-03-30 NOTE — ED Provider Notes (Incomplete)
[facility]  Note:  This document was prepared using Conservation officer, historic buildings and may include unintentional dictation errors.  MRN: [id number] DOB: [birth year]  Subjective:   Rickey Davis is a 42 y.o. male presenting for 3-day history of bilateral ear pain much more substantial on the left side.  Has a sense that his ear is really full.  He does have a history of chronic sinus congestion and sinus fullness.  Does not take anything for this.  Patient is a smoker, does smoke just under 1 pack/day.  Also has a history of a deviated septum.  No fever, sore throat, cough, tinnitus, dizziness, spontaneous ear drainage.  No current facility-administered medications for this encounter.  Current Outpatient Medications:    allopurinol (ZYLOPRIM) 100 MG tablet, Take 1 tablet (100 mg total) by mouth daily., Disp: 90 tablet, Rfl: 1   ibuprofen (ADVIL,MOTRIN) 200 MG tablet, Take 200 mg by mouth as needed., Disp: , Rfl:    indomethacin (INDOCIN) 50 MG capsule, Take 1 capsule (50 mg total) by mouth 2 (two) times daily as needed. With meals, Disp: 60 capsule, Rfl: 1   lisinopril (ZESTRIL) 5 MG tablet, Take 1 tablet (5 mg total) by mouth daily., Disp: 30 tablet, Rfl: 1   No Known Allergies  Past Medical History:  Diagnosis Date   Hypertension      Past Surgical History:  Procedure Laterality Date   BICEPS TENDON REPAIR     HAND SURGERY      Family History  Problem Relation Age of Onset   Hypertension Other    Hyperlipidemia Other    CAD Other    Hypertension Mother    Hypertension Father    Non-Hodgkin's lymphoma Sister    Hypertension Brother    Hyperlipidemia Brother     Social History   Tobacco Use   Smoking status: Every Day    Current packs/day: 0.75    Average packs/day: 0.8 packs/day for 13.0 years (9.8 ttl pk-yrs)    Types: Cigarettes   Smokeless tobacco: Never  Vaping Use   Vaping status: Never Used  Substance Use Topics   Alcohol use: Yes     Comment: occ   Drug use: Yes    Types: Marijuana    Comment: once a year    ROS   Objective:   Vitals: BP (!) 139/91 (BP Location: Right Arm)   Pulse 76   Temp 98.3 F (36.8 C) (Oral)   Resp 18   SpO2 95%   Physical Exam Constitutional:      General: He is not in acute distress.    Appearance: Normal appearance. He is well-developed and normal weight. He is not ill-appearing, toxic-appearing or diaphoretic.  HENT:     Head: Normocephalic and atraumatic.     Right Ear: There is no impacted cerumen.     Left Ear: There is no impacted cerumen.     Ears:     Comments: TMs opacified bilaterally.  Significant tenderness of the external ear canal of the left side with the use of this otoscope.  Obvious complete white drainage.    Nose: Nose normal.     Mouth/Throat:     Pharynx: Oropharynx is clear.  Eyes:     General: No scleral icterus.       Right eye: No discharge.        Left eye: No discharge.     Extraocular Movements: Extraocular movements intact.  Cardiovascular:  Rate and Rhythm: Normal rate.  Pulmonary:     Effort: Pulmonary effort is normal.  Musculoskeletal:     Cervical back: Normal range of motion.  Neurological:     Mental Status: He is alert and oriented to person, place, and time.  Psychiatric:        Mood and Affect: Mood normal.        Behavior: Behavior normal.        Thought Content: Thought content normal.        Judgment: Judgment normal.     Assessment and Plan :   PDMP not reviewed this encounter.  1. Eustachian tube dysfunction, bilateral   2. Infective otitis externa of left ear   3. Smoker    Recommend starting Cortisporin HC for infective otitis externa of the left ear.  Otherwise, will use conservative management for what I suspect is eustachian tube dysfunction.  Recommended starting Zyrtec, Sudafed especially in light of the deviated septum and smoking.  If there is no clinical improvement, considered Ciprodex otic and  follow-up with ENT or recheck in the clinic as deemed necessary by the onsite provider.  Counseled patient on potential for adverse effects with medications prescribed/recommended today, ER and return-to-clinic precautions discussed, patient verbalized understanding.     Wallis Bamberg, [place] 03/31/23 [phone]

## 2023-03-30 NOTE — ED Triage Notes (Signed)
Pt reports body aches and right ear pain x 3 days, left ear pain x 2 days.

## 2023-04-01 ENCOUNTER — Ambulatory Visit
Admission: EM | Admit: 2023-04-01 | Discharge: 2023-04-01 | Disposition: A | Discharge status: Home / Self Care | Payer: 59 | Attending: Internal Medicine | Admitting: Internal Medicine

## 2023-04-01 DIAGNOSIS — H60393 Other infective otitis externa, bilateral: Secondary | ICD-10-CM

## 2023-04-01 DIAGNOSIS — H6993 Unspecified Eustachian tube disorder, bilateral: Secondary | ICD-10-CM

## 2023-04-01 MED ORDER — PREDNISONE 20 MG PO TABS
40.0000 mg | ORAL_TABLET | Freq: Every day | ORAL | 0 refills | Status: AC
Start: 2023-04-01 — End: 2023-04-06

## 2023-04-01 MED ORDER — CIPROFLOXACIN-DEXAMETHASONE 0.3-0.1 % OT SUSP
4.0000 [drp] | Freq: Two times a day (BID) | OTIC | 0 refills | Status: AC
Start: 2023-04-01 — End: 2023-04-08

## 2023-04-01 NOTE — ED Provider Notes (Signed)
[facility]    CSN: [id number] Arrival date & time: 04/01/23  1014      History   Chief Complaint No chief complaint on file.   HPI Rickey Davis is a 42 y.o. male presents for evaluation of ear pain.  Patient reports 5 days of what initially began as left-sided ear pain with muffled hearing.  He was seen in urgent care on 8/12 for this complaint was diagnosed with left otitis externa and eustachian tube dysfunction.  He was started on cetirizine, Cortisporin antibiotic drops and Sudafed.  He reports minimal improvement with this treatment and on states his right ear is beginning to feel swollen as well.  Denies any fevers or chills or drainage.  Denies any URI symptoms.  Denies any excessive water in ears such as swimming.  He has been taking over-the-counter Motrin as needed.  No other concerns at this time.  HPI  Past Medical History:  Diagnosis Date   Hypertension     Patient Active Problem List   Diagnosis Date Noted   Pain in joint of right elbow 09/28/2018   Gout of left ankle 01/27/2017   Benign essential HTN 12/25/2016   BMI 32.0-32.9,adult 12/25/2016   Plantar wart of left foot 12/25/2016    Past Surgical History:  Procedure Laterality Date   BICEPS TENDON REPAIR     HAND SURGERY         Home Medications    Prior to Admission medications   Medication Sig Start Date End Date Taking? Authorizing Provider  ciprofloxacin-dexamethasone (CIPRODEX) OTIC suspension Place 4 drops into both ears 2 (two) times daily for 7 days. 04/01/23 04/08/23 Yes Radford Pax, NP  predniSONE (DELTASONE) 20 MG tablet Take 2 tablets (40 mg total) by mouth daily with breakfast for 5 days. 04/01/23 04/06/23 Yes Radford Pax, NP  allopurinol (ZYLOPRIM) 100 MG tablet Take 1 tablet (100 mg total) by mouth daily. 12/20/18   Lezlie Lye, Meda Coffee, MD  cetirizine (ZYRTEC ALLERGY) 10 MG tablet Take 1 tablet (10 mg total) by mouth daily. 03/30/23   Wallis Bamberg, PA-C  ibuprofen (ADVIL,MOTRIN)  200 MG tablet Take 200 mg by mouth as needed.    [provider]  indomethacin (INDOCIN) 50 MG capsule Take 1 capsule (50 mg total) by mouth 2 (two) times daily as needed. With meals 12/20/18   Lezlie Lye, Meda Coffee, MD  lisinopril (ZESTRIL) 5 MG tablet Take 1 tablet (5 mg total) by mouth daily. 12/20/18   Noni Saupe, MD    Family History Family History  Problem Relation Age of Onset   Hypertension Other    Hyperlipidemia Other    CAD Other    Hypertension Mother    Hypertension Father    Non-Hodgkin's lymphoma Sister    Hypertension Brother    Hyperlipidemia Brother     Social History Social History   Tobacco Use   Smoking status: Every Day    Current packs/day: 0.75    Average packs/day: 0.8 packs/day for 13.0 years (9.8 ttl pk-yrs)    Types: Cigarettes   Smokeless tobacco: Never  Vaping Use   Vaping status: Never Used  Substance Use Topics   Alcohol use: Yes    Comment: occ   Drug use: Yes    Types: Marijuana    Comment: once a year     Allergies   Patient has no known allergies.   Review of Systems Review of Systems  HENT:  Positive for ear  pain.      Physical Exam Triage Vital Signs ED Triage Vitals  Encounter Vitals Group     BP 04/01/23 1025 128/84     Systolic BP Percentile --      Diastolic BP Percentile --      Pulse Rate 04/01/23 1025 74     Resp 04/01/23 1025 16     Temp 04/01/23 1025 98.1 F (36.7 C)     Temp Source 04/01/23 1025 Oral     SpO2 04/01/23 1025 96 %     Weight --      Height --      Head Circumference --      Peak Flow --      Pain Score 04/01/23 1024 5     Pain Loc --      Pain Education --      Exclude from Growth Chart --    No data found.  Updated Vital Signs BP 128/84 (BP Location: Left Arm)   Pulse 74   Temp 98.1 F (36.7 C) (Oral)   Resp 16   SpO2 96%   Visual Acuity Right Eye Distance:   Left Eye Distance:   Bilateral Distance:    Right Eye Near:   Left Eye Near:    Bilateral  Near:     Physical Exam Vitals and nursing note reviewed.  Constitutional:      General: He is not in acute distress.    Appearance: Normal appearance. He is not ill-appearing, toxic-appearing or diaphoretic.  HENT:     Head: Normocephalic and atraumatic.     Right Ear: Swelling and tenderness present. No drainage. A middle ear effusion is present. No mastoid tenderness. No hemotympanum. Tympanic membrane is not injected, perforated, erythematous, retracted or bulging.     Left Ear: Swelling and tenderness present. No drainage. A middle ear effusion is present. No mastoid tenderness. No hemotympanum. Tympanic membrane is not injected, perforated, erythematous, retracted or bulging.  Eyes:     Pupils: Pupils are equal, round, and reactive to light.  Cardiovascular:     Rate and Rhythm: Normal rate.  Pulmonary:     Effort: Pulmonary effort is normal.  Skin:    General: Skin is warm and dry.  Neurological:     General: No focal deficit present.     Mental Status: He is alert and oriented to person, place, and time.  Psychiatric:        Mood and Affect: Mood normal.        Behavior: Behavior normal.      UC Treatments / Results  Labs (all labs ordered are listed, but only abnormal results are displayed) Labs Reviewed - No data to display  EKG   Radiology No results found.  Procedures Procedures (including critical care time)  Medications Ordered in UC Medications - No data to display  Initial Impression / Assessment and Plan / UC Course  I have reviewed the triage vital signs and the nursing notes.  Pertinent labs & imaging results that were available during my care of the patient were reviewed by me and considered in my medical decision making (see chart for details).     Reviewed exam and symptoms with patient.  No red flags.  Will stop Cortisporin and start Ciprodex drops twice daily for 7 days.  He will stop Sudafed and start prednisone daily for 5 days.  He may  continue cetirizine and also advised over-the-counter Flonase daily.  Instructed no water in the  ears until treatment is complete.  PCP follow-up if symptoms do not improve.  ER precautions reviewed and patient verbalized understanding. Final Clinical Impressions(s) / UC Diagnoses   Final diagnoses:  Infective otitis externa of both ears  Eustachian tube dysfunction, bilateral     Discharge Instructions      Stop the previously prescribed Cortisporin antibiotic drops and also stop the Sudafed.  Will start you on Ciprodex antibiotic drops 4 drops to both ears twice daily for 7 days.  Keep water out of the ear until your treatment is complete and your symptoms have resolved.  Start prednisone daily for 5 days.  Also start over-the-counter Flonase daily.  Please follow-up with your PCP if your symptoms are not improving.  Please go to the ER for any worsening symptoms.  I hope you feel better soon!    ED Prescriptions     Medication Sig Dispense Auth. Provider   ciprofloxacin-dexamethasone (CIPRODEX) OTIC suspension Place 4 drops into both ears 2 (two) times daily for 7 days. 7.5 mL Radford Pax, NP   predniSONE (DELTASONE) 20 MG tablet Take 2 tablets (40 mg total) by mouth daily with breakfast for 5 days. 10 tablet Radford Pax, NP      PDMP not reviewed this encounter.   Radford Pax, NP 04/01/23 1050

## 2023-04-01 NOTE — Discharge Instructions (Signed)
Stop the previously prescribed Cortisporin antibiotic drops and also stop the Sudafed.  Will start you on Ciprodex antibiotic drops 4 drops to both ears twice daily for 7 days.  Keep water out of the ear until your treatment is complete and your symptoms have resolved.  Start prednisone daily for 5 days.  Also start over-the-counter Flonase daily.  Please follow-up with your PCP if your symptoms are not improving.  Please go to the ER for any worsening symptoms.  I hope you feel better soon!

## 2023-04-01 NOTE — ED Triage Notes (Signed)
Pt presents to UC w/ c/o bilateral ear pain that has worsened over the past 2 days since being seen here 2 days ago. Pt has been taking the prescribed medications on schedule, but now states the pain is worse and has a headache and has muffled hearing. Ibuprofen and tylenol do not help.
